# Patient Record
Sex: Female | Born: 1935 | Race: Black or African American | Hispanic: No | Smoking: Former smoker
Health system: Southern US, Community
[De-identification: ages and names within clinical notes are randomized; demographics above are authoritative.]

## PROBLEM LIST (undated history)

## (undated) DIAGNOSIS — E785 Hyperlipidemia, unspecified: Secondary | ICD-10-CM

## (undated) DIAGNOSIS — I499 Cardiac arrhythmia, unspecified: Secondary | ICD-10-CM

## (undated) HISTORY — PX: REPLACEMENT TOTAL KNEE: SUR1224

## (undated) HISTORY — PX: JOINT REPLACEMENT: SHX530

---

## 2018-03-10 ENCOUNTER — Encounter (HOSPITAL_BASED_OUTPATIENT_CLINIC_OR_DEPARTMENT_OTHER): Payer: Self-pay | Admitting: *Deleted

## 2018-03-10 ENCOUNTER — Emergency Department (HOSPITAL_BASED_OUTPATIENT_CLINIC_OR_DEPARTMENT_OTHER): Payer: Medicare Other

## 2018-03-10 ENCOUNTER — Other Ambulatory Visit: Payer: Self-pay

## 2018-03-10 ENCOUNTER — Emergency Department (HOSPITAL_BASED_OUTPATIENT_CLINIC_OR_DEPARTMENT_OTHER)
Admission: EM | Admit: 2018-03-10 | Discharge: 2018-03-10 | Disposition: A | Discharge status: Home / Self Care | Payer: Medicare Other | Attending: Emergency Medicine | Admitting: Emergency Medicine

## 2018-03-10 DIAGNOSIS — Z79899 Other long term (current) drug therapy: Secondary | ICD-10-CM | POA: Diagnosis not present

## 2018-03-10 DIAGNOSIS — S0990XA Unspecified injury of head, initial encounter: Secondary | ICD-10-CM | POA: Diagnosis present

## 2018-03-10 DIAGNOSIS — Z87891 Personal history of nicotine dependence: Secondary | ICD-10-CM | POA: Diagnosis not present

## 2018-03-10 DIAGNOSIS — S0083XA Contusion of other part of head, initial encounter: Secondary | ICD-10-CM | POA: Insufficient documentation

## 2018-03-10 DIAGNOSIS — Y939 Activity, unspecified: Secondary | ICD-10-CM | POA: Insufficient documentation

## 2018-03-10 DIAGNOSIS — Y92512 Supermarket, store or market as the place of occurrence of the external cause: Secondary | ICD-10-CM | POA: Diagnosis not present

## 2018-03-10 DIAGNOSIS — Y999 Unspecified external cause status: Secondary | ICD-10-CM | POA: Insufficient documentation

## 2018-03-10 DIAGNOSIS — W228XXA Striking against or struck by other objects, initial encounter: Secondary | ICD-10-CM | POA: Diagnosis not present

## 2018-03-10 DIAGNOSIS — Z7982 Long term (current) use of aspirin: Secondary | ICD-10-CM | POA: Insufficient documentation

## 2018-03-10 HISTORY — DX: Hyperlipidemia, unspecified: E78.5

## 2018-03-10 HISTORY — DX: Cardiac arrhythmia, unspecified: I49.9

## 2018-03-10 NOTE — ED Triage Notes (Signed)
pt c/o head injury by picture from x 2 hrs ago

## 2018-03-10 NOTE — Discharge Instructions (Signed)
Please read instructions below. You can take tylenol every 4-6 hours as needed for headache. Apply ice to your head for 20 minutes at a time for swelling and pain. Schedule an appointment with your primary care provider to follow up. Return to the ER for severe headache, vision changes, vomiting, or new or concerning symptoms.

## 2018-03-10 NOTE — ED Provider Notes (Signed)
[facility] EMERGENCY DEPARTMENT Provider Note   CSN: [id number] Arrival date & time: 03/10/18  1431     History   Chief Complaint Chief Complaint  Patient presents with  . Head Injury    HPI Raven Baker is a 84 y.o. female presenting to the ED with head injury that occurred prior to arrival.  Patient states she was at a store and a shelf with canvas pictures/artwork the right forehead.  She states she did not have a syncopal episode, nor any other injuries.  She denies headache other than pain associated with contusion.  Denies vision changes, neck pain, nausea or vomiting, or any other complaints.  Not on anticoagulation.  The history is provided by the patient.    Past Medical History:  Diagnosis Date  . Arrhythmia   . Hyperlipidemia     There are no active problems to display for this patient.   Past Surgical History:  Procedure Laterality Date  . JOINT REPLACEMENT       OB History   None      Home Medications    Prior to Admission medications   Medication Sig Start Date End Date Taking? Authorizing Provider  aspirin 81 MG chewable tablet Chew 81 mg by mouth daily.   Yes [provider]  NIFEdipine (PROCARDIA XL/ADALAT-CC) 60 MG 24 hr tablet Take 60 mg by mouth daily.   Yes [provider]  rosuvastatin (CRESTOR) 5 MG tablet Take 5 mg by mouth daily.   Yes [provider]    Family History History reviewed. No pertinent family history.  Social History Social History   Tobacco Use  . Smoking status: Former Games developer  . Smokeless tobacco: Never Used  Substance Use Topics  . Alcohol use: Never    Frequency: Never  . Drug use: Never     Allergies   Plavix [clopidogrel]   Review of Systems Review of Systems  Eyes: Negative for photophobia and visual disturbance.  Gastrointestinal: Negative for nausea and vomiting.  Musculoskeletal: Negative for neck pain.  Neurological: Negative for syncope and headaches.    Hematological: Does not bruise/bleed easily.  All other systems reviewed and are negative.    Physical Exam Updated Vital Signs BP 131/89   Pulse 86   Resp 16   Ht 5\' 2"  (1.575 m)   Wt 76.2 kg (168 lb)   SpO2 97%   BMI 30.73 kg/m   Physical Exam  Constitutional: She is oriented to person, place, and time. She appears well-developed and well-nourished. No distress.  HENT:  Head: Normocephalic.  Hematoma to right forehead, no crepitus or deformity to facial bones or orbits. No wounds.  Eyes: Pupils are equal, round, and reactive to light. Conjunctivae and EOM are normal.  Neck: Normal range of motion. Neck supple.  No c-spine or paraspinal tenderness  Cardiovascular: Normal rate.  Pulmonary/Chest: Effort normal.  Abdominal: Soft.  Neurological: She is alert and oriented to person, place, and time.  Mental Status:  Alert, oriented, thought content appropriate, able to give a coherent history. Speech fluent without evidence of aphasia. Able to follow 2 step commands without difficulty.  Cranial Nerves:  II:  Peripheral visual fields grossly normal, pupils equal, round, reactive to light III,IV, VI: ptosis not present, extra-ocular motions intact bilaterally  V,VII: smile symmetric, facial light touch sensation equal VIII: hearing grossly normal to voice  X: uvula elevates symmetrically  XI: bilateral shoulder shrug symmetric and strong XII: midline tongue extension without fassiculations Motor:  Normal tone. 5/5 in upper and lower extremities bilaterally including strong and equal grip strength and dorsiflexion/plantar flexion Sensory: Pinprick and light touch normal in all extremities.  Deep Tendon Reflexes: 2+ and symmetric in the biceps and patella Cerebellar: normal finger-to-nose with bilateral upper extremities Gait: normal gait and balance CV: distal pulses palpable throughout     Skin: Skin is warm.  Psychiatric: She has a normal mood and affect. Her behavior is  normal.  Nursing note and vitals reviewed.    ED Treatments / Results  Labs (all labs ordered are listed, but only abnormal results are displayed) Labs Reviewed - No data to display  EKG None  Radiology Ct Head Wo Contrast  Result Date: 03/10/2018 CLINICAL DATA:  The patient suffered a blow to the head today when a shelf in a store fell on the patient's head. Initial encounter. EXAM: CT HEAD WITHOUT CONTRAST TECHNIQUE: Contiguous axial images were obtained from the base of the skull through the vertex without intravenous contrast. COMPARISON:  None. FINDINGS: Brain: No evidence of acute infarction, hemorrhage, hydrocephalus, extra-axial collection or mass lesion/mass effect. Atrophy and chronic microvascular ischemic change are noted. Vascular: No hyperdense vessel or unexpected calcification. Skull: Intact. Sinuses/Orbits: Negative. Other: None. IMPRESSION: No acute abnormality. Atrophy and chronic microvascular ischemic change. Electronically Signed   By: Drusilla Kannerhomas  Dalessio M.D.   On: 03/10/2018 16:01    Procedures Procedures (including critical care time)  Medications Ordered in ED Medications - No data to display   Initial Impression / Assessment and Plan / ED Course  I have reviewed the triage vital signs and the nursing notes.  Pertinent labs & imaging results that were available during my care of the patient were reviewed by me and considered in my medical decision making (see chart for details).     Pt with forehead contusion after an object fell onto her head at a store. No LOC, HA, vision changes, N/V. Normal neurologic exam. Not on anticoagulation. CT head neg for acute pathology. Pt declined tylenol in ED, stating pain was only present with palpation of hematoma. Ice pack given. Pt well-appearing, safe for discharge.  Patient discussed with Dr. Dalene SeltzerSchlossman, who agrees with care plan.  Discussed results, findings, treatment and follow up. Patient advised of return  precautions. Patient verbalized understanding and agreed with plan.  Final Clinical Impressions(s) / ED Diagnoses   Final diagnoses:  Contusion of face, initial encounter    ED Discharge Orders    None       Robinson, [place] N, PA-C 03/10/18 1622    Alvira MondaySchlossman, Erin, MD 03/12/18 2243

## 2018-03-17 ENCOUNTER — Ambulatory Visit (INDEPENDENT_AMBULATORY_CARE_PROVIDER_SITE_OTHER): Payer: Medicare Other | Admitting: Allergy and Immunology

## 2018-03-17 ENCOUNTER — Encounter: Payer: Self-pay | Admitting: Allergy and Immunology

## 2018-03-17 VITALS — BP 150/80 | HR 67 | Temp 98.1°F | Resp 16 | Ht 62.0 in | Wt 79.5 lb

## 2018-03-17 DIAGNOSIS — IMO0001 Reserved for inherently not codable concepts without codable children: Secondary | ICD-10-CM

## 2018-03-17 DIAGNOSIS — L500 Allergic urticaria: Secondary | ICD-10-CM | POA: Diagnosis not present

## 2018-03-17 DIAGNOSIS — H6982 Other specified disorders of Eustachian tube, left ear: Secondary | ICD-10-CM | POA: Diagnosis not present

## 2018-03-17 DIAGNOSIS — H6980 Other specified disorders of Eustachian tube, unspecified ear: Secondary | ICD-10-CM | POA: Insufficient documentation

## 2018-03-17 DIAGNOSIS — T7840XD Allergy, unspecified, subsequent encounter: Secondary | ICD-10-CM | POA: Diagnosis not present

## 2018-03-17 DIAGNOSIS — Z91018 Allergy to other foods: Secondary | ICD-10-CM | POA: Insufficient documentation

## 2018-03-17 DIAGNOSIS — J3089 Other allergic rhinitis: Secondary | ICD-10-CM | POA: Diagnosis not present

## 2018-03-17 DIAGNOSIS — T7840XA Allergy, unspecified, initial encounter: Secondary | ICD-10-CM | POA: Insufficient documentation

## 2018-03-17 MED ORDER — FLUTICASONE PROPIONATE 50 MCG/ACT NA SUSP: 1.0000 | Freq: Two times a day (BID) | NASAL | 5 refills | Status: DC | PRN

## 2018-03-17 MED ORDER — FEXOFENADINE HCL 60 MG PO TABS: 60.0000 mg | ORAL_TABLET | Freq: Two times a day (BID) | ORAL | 2 refills | Status: AC | PRN

## 2018-03-17 MED ORDER — FEXOFENADINE HCL 180 MG PO TABS: 180.0000 mg | ORAL_TABLET | Freq: Every day | ORAL | 5 refills | Status: DC

## 2018-03-17 NOTE — Patient Instructions (Addendum)
History of food allergy/allergic reaction Allergic reaction with unclear etiology.  The patients history suggests peanut allergy, though todays skin tests were negative despite a positive histamine control.  Food allergen skin testing has excellent negative predictive value however there is still a 5% chance that the allergy exists.  Therefore, we will investigate further with serum specific IgE levels and, if negative, open graded oral challenge.  A laboratory order form has been provided for serum tryptase level as well as specific IgE against peanut, peanut components, tree nut panel, and alpha gal panel.  Until the food allergy has been definitively ruled out, the patient is to continue meticulous avoidance of peanuts and tree nuts.  Should symptoms recur, a journal is to be kept recording any foods eaten, beverages consumed, and medications taken within a 6 hour time period prior to the onset of symptoms, as well as record activities being performed, and environmental conditions. For any symptoms concerning for anaphylaxis, 911 is to be called immediately.  Seasonal and perennial allergic rhinitis  Aeroallergen avoidance measures have been discussed and provided in written form.  Prescription has been provided for fexofenadine 60 mg twice daily if needed.  A prescription has been provided for fluticasone nasal spray, one spray per nostril 1-2 times daily as needed. Proper nasal spray technique has been discussed and demonstrated.  Nasal saline spray (i.e., Simply Saline) or nasal saline lavage (i.e., NeilMed) is recommended as needed and prior to medicated nasal sprays.  Eustachian tube dysfunction  Allergen avoidance measures, fluticasone nasal spray as needed, and nasal saline irrigation as needed.   When lab results have returned the patient will be called with further recommendations and follow up instructions.  Control of House Dust Mite Allergen  House dust mites play a major  role in allergic asthma and rhinitis.  They occur in environments with high humidity wherever human skin, the food for dust mites is found. High levels have been detected in dust obtained from mattresses, pillows, carpets, upholstered furniture, bed covers, clothes and soft toys.  The principal allergen of the house dust mite is found in its feces.  A gram of dust may contain 1,000 mites and 250,000 fecal particles.  Mite antigen is easily measured in the air during house cleaning activities.    1. Encase mattresses, including the box spring, and pillow, in an air tight cover.  Seal the zipper end of the encased mattresses with wide adhesive tape. 2. Wash the bedding in water of 130 degrees Farenheit weekly.  Avoid cotton comforters/quilts and flannel bedding: the most ideal bed covering is the dacron comforter. 3. Remove all upholstered furniture from the bedroom. 4. Remove carpets, carpet padding, rugs, and non-washable window drapes from the bedroom.  Wash drapes weekly or use plastic window coverings. 5. Remove all non-washable stuffed toys from the bedroom.  Wash stuffed toys weekly. 6. Have the room cleaned frequently with a vacuum cleaner and a damp dust-mop.  The patient should not be in a room which is being cleaned and should wait 1 hour after cleaning before going into the room. 7. Close and seal all heating outlets in the bedroom.  Otherwise, the room will become filled with dust-laden air.  An electric heater can be used to heat the room. 8. Reduce indoor humidity to less than 50%.  Do not use a humidifier.  Reducing Pollen Exposure  The American Academy of Allergy, Asthma and Immunology suggests the following steps to reduce your exposure to pollen during allergy seasons.  1. Do not hang sheets or clothing out to dry; pollen may collect on these items. 2. Do not mow lawns or spend time around freshly cut grass; mowing stirs up pollen. 3. Keep windows closed at night.  Keep car  windows closed while driving. 4. Minimize morning activities outdoors, a time when pollen counts are usually at their highest. 5. Stay indoors as much as possible when pollen counts or humidity is high and on windy days when pollen tends to remain in the air longer. 6. Use air conditioning when possible.  Many air conditioners have filters that trap the pollen spores. 7. Use a HEPA room air filter to remove pollen form the indoor air you breathe.

## 2018-03-17 NOTE — Assessment & Plan Note (Addendum)
Allergic reaction with unclear etiology.  The patients history suggests peanut allergy, though todays skin tests were negative despite a positive histamine control.  Food allergen skin testing has excellent negative predictive value however there is still a 5% chance that the allergy exists.  Therefore, we will investigate further with serum specific IgE levels and, if negative, open graded oral challenge.  A laboratory order form has been provided for serum tryptase level as well as specific IgE against peanut, peanut components, tree nut panel, and alpha gal panel.  Until the food allergy has been definitively ruled out, the patient is to continue meticulous avoidance of peanuts and tree nuts.  Should symptoms recur, a journal is to be kept recording any foods eaten, beverages consumed, and medications taken within a 6 hour time period prior to the onset of symptoms, as well as record activities being performed, and environmental conditions. For any symptoms concerning for anaphylaxis, 911 is to be called immediately.

## 2018-03-17 NOTE — Progress Notes (Signed)
New Patient Note  RE: Raven Baker MRN: [id number] DOB: 06-Aug-1934 Date of Office Visit: 03/17/2018  Referring provider: Vernona Rieger, MD Primary care provider: Vernona Rieger, MD  Chief Complaint: Allergic Reaction and Pruritus   History of present illness: [name] is a 84 y.o. female seen today in consultation requested by Vernona Rieger, MD.  She typically consumes a handful of peanuts almost every evening before going to bed.  Approximately 3 weeks ago, she consumed peanuts and shortly thereafter experienced generalized pruritus.  Few days later, she had a handful of peanuts and within 15 minutes developed severe pruritus on her upper body and noted that her chest was "all red."  She denies concomitant angioedema, cardiopulmonary symptoms, or GI symptoms.  She has carefully avoided peanuts and tree nuts since that time.  She does not recall what she had consumed for dinner on those evenings. Raven Baker experiences nasal congestion, rhinorrhea, sneezing, postnasal drainage, and occasional left ear pressure/pain.  These symptoms occur year-round but are most frequent and severe during the springtime.  She believes that her allergies are related to tree pollen exposure.  Assessment and plan: History of food allergy/allergic reaction Allergic reaction with unclear etiology.  The patients history suggests peanut allergy, though todays skin tests were negative despite a positive histamine control.  Food allergen skin testing has excellent negative predictive value however there is still a 5% chance that the allergy exists.  Therefore, we will investigate further with serum specific IgE levels and, if negative, open graded oral challenge.  A laboratory order form has been provided for serum tryptase level as well as specific IgE against peanut, peanut components, tree nut panel, and alpha gal panel.  Until the food allergy has been definitively ruled out, the patient is to continue  meticulous avoidance of peanuts and tree nuts.  Should symptoms recur, a journal is to be kept recording any foods eaten, beverages consumed, and medications taken within a 6 hour time period prior to the onset of symptoms, as well as record activities being performed, and environmental conditions. For any symptoms concerning for anaphylaxis, 911 is to be called immediately.  Seasonal and perennial allergic rhinitis  Aeroallergen avoidance measures have been discussed and provided in written form.  Prescription has been provided for fexofenadine 60 mg twice daily if needed.  A prescription has been provided for fluticasone nasal spray, one spray per nostril 1-2 times daily as needed. Proper nasal spray technique has been discussed and demonstrated.  Nasal saline spray (i.e., Simply Saline) or nasal saline lavage (i.e., NeilMed) is recommended as needed and prior to medicated nasal sprays.  Eustachian tube dysfunction  Allergen avoidance measures, fluticasone nasal spray as needed, and nasal saline irrigation as needed.   Meds ordered this encounter  Medications  . fluticasone (FLONASE) 50 MCG/ACT nasal spray    Sig: Place 1 spray into both nostrils 2 (two) times daily as needed for allergies or rhinitis.    Dispense:  18.2 g    Refill:  5  . DISCONTD: fexofenadine (ALLEGRA) 180 MG tablet    Sig: Take 1 tablet (180 mg total) by mouth daily.    Dispense:  30 tablet    Refill:  5  . fexofenadine (ALLEGRA) 60 MG tablet    Sig: Take 1 tablet (60 mg total) by mouth 2 (two) times daily as needed for allergies or rhinitis.    Dispense:  60 tablet    Refill:  2    Diagnostics: Environmental skin testing:  Positive to grass pollen and dust mite antigen. Food allergen skin testing: Negative despite a positive histamine control.    Physical examination: Blood pressure (!) 150/80, pulse 67, temperature 98.1 F (36.7 C), temperature source Oral, resp. rate 16, height  (1.575 m),  weight 79 lb 8 oz (36.1 kg), SpO2 96 %.  General: Alert, interactive, in no acute distress. HEENT: TMs pearly gray, turbinates moderately edematous without discharge, post-pharynx mildly erythematous. Neck: Supple without lymphadenopathy. Lungs: Clear to auscultation without wheezing, rhonchi or rales. CV: Normal S1, S2 without murmurs. Abdomen: Nondistended, nontender. Skin: Warm and dry, without lesions or rashes. Extremities:  No clubbing, cyanosis or edema. Neuro:   Grossly intact.  Review of systems:  Review of systems negative except as noted in HPI / PMHx or noted below: Review of Systems  Constitutional: Negative.   HENT: Negative.   Eyes: Negative.   Respiratory: Negative.   Cardiovascular: Negative.   Gastrointestinal: Negative.   Genitourinary: Negative.   Musculoskeletal: Negative.   Skin: Negative.   Neurological: Negative.   Endo/Heme/Allergies: Negative.   Psychiatric/Behavioral: Negative.     Past medical history:  Past Medical History:  Diagnosis Date  . Arrhythmia   . Hyperlipidemia     Past surgical history:  Past Surgical History:  Procedure Laterality Date  . JOINT REPLACEMENT    . REPLACEMENT TOTAL KNEE      Family history: Family History  Problem Relation Age of Onset  . Allergic rhinitis Neg Hx   . Angioedema Neg Hx   . Asthma Neg Hx   . Eczema Neg Hx   . Immunodeficiency Neg Hx   . Urticaria Neg Hx     Social history: Social History   Socioeconomic History  . Marital status: Widowed    Spouse name: Not on file  . Number of children: Not on file  . Years of education: Not on file  . Highest education level: Not on file  Occupational History  . Not on file  Social Needs  . Financial resource strain: Not on file  . Food insecurity:    Worry: Not on file    Inability: Not on file  . Transportation needs:    Medical: Not on file    Non-medical: Not on file  Tobacco Use  . Smoking status: Former Games developer  . Smokeless tobacco:  Never Used  Substance and Sexual Activity  . Alcohol use: Never    Frequency: Never  . Drug use: Never  . Sexual activity: Not on file  Lifestyle  . Physical activity:    Days per week: Not on file    Minutes per session: Not on file  . Stress: Not on file  Relationships  . Social connections:    Talks on phone: Not on file    Gets together: Not on file    Attends religious service: Not on file    Active member of club or organization: Not on file    Attends meetings of clubs or organizations: Not on file    Relationship status: Not on file  . Intimate partner violence:    Fear of current or ex partner: Not on file    Emotionally abused: Not on file    Physically abused: Not on file    Forced sexual activity: Not on file  Other Topics Concern  . Not on file  Social History Narrative  . Not on file   Environmental History: The patient lives in a 82 year old house with hardwood floors  throughout and central air/heat.  There is no known mold/water damage in the home.  She is a non-smoker without pets.  Allergies as of 03/17/2018      Reactions   Streptomycin Other (See Comments), Rash   Unknown    Plavix [clopidogrel]       Medication List        Accurate as of 03/17/18 10:54 AM. Always use your most recent med list.          alendronate 70 MG tablet Commonly known as:  FOSAMAX Take 70 mg by mouth daily.   aspirin 81 MG chewable tablet Chew 81 mg by mouth daily.   calcium carbonate 600 MG Tabs tablet Commonly known as:  OS-CAL Take 600 mg by mouth daily.   fexofenadine 60 MG tablet Commonly known as:  ALLEGRA Take 1 tablet (60 mg total) by mouth 2 (two) times daily as needed for allergies or rhinitis.   fluticasone 50 MCG/ACT nasal spray Commonly known as:  FLONASE Place 1 spray into both nostrils 2 (two) times daily as needed for allergies or rhinitis.   NIFEdipine 60 MG 24 hr tablet Commonly known as:  PROCARDIA XL/ADALAT-CC Take 60 mg by mouth daily.     rosuvastatin 5 MG tablet Commonly known as:  CRESTOR Take 5 mg by mouth daily.   THERA Tabs Take 1 tablet by mouth daily.   Vitamin D-3 1000 units Caps Take 1,000 capsules by mouth daily.       Known medication allergies: Allergies  Allergen Reactions  . Streptomycin Other (See Comments) and Rash    Unknown    . Plavix [Clopidogrel]     I appreciate the opportunity to take part in [facility] care. Please do not hesitate to contact me with questions.  Sincerely,   R. Jorene Guest, MD

## 2018-03-17 NOTE — Assessment & Plan Note (Signed)
   Allergen avoidance measures, fluticasone nasal spray as needed, and nasal saline irrigation as needed.

## 2018-03-17 NOTE — Assessment & Plan Note (Signed)
   Aeroallergen avoidance measures have been discussed and provided in written form.  Prescription has been provided for fexofenadine 60 mg twice daily if needed.  A prescription has been provided for fluticasone nasal spray, one spray per nostril 1-2 times daily as needed. Proper nasal spray technique has been discussed and demonstrated.  Nasal saline spray (i.e., Simply Saline) or nasal saline lavage (i.e., NeilMed) is recommended as needed and prior to medicated nasal sprays.

## 2018-03-28 LAB — ALPHA-GAL PANEL
Alpha Gal IgE*: <0.1 kU/L (ref <0.10)
Beef (Bos spp) IgE: <0.1 kU/L (ref <0.35)
Class Interpretation: 0
Class Interpretation: 0
Class Interpretation: 0
Lamb/Mutton (Ovis spp) IgE: <0.1 kU/L (ref <0.35)
Pork (Sus spp) IgE: <0.1 kU/L (ref <0.35)

## 2018-03-28 LAB — TRYPTASE: Tryptase: 6.2 ug/L (ref 2.2–13.2)

## 2018-03-28 LAB — ALLERGENS(7)
Brazil Nut IgE: <0.1 kU/L
F020-IgE Almond: <0.1 kU/L
F202-IgE Cashew Nut: <0.1 kU/L
Hazelnut (Filbert) IgE: <0.1 kU/L
Peanut IgE: <0.1 kU/L
Pecan Nut IgE: <0.1 kU/L
Walnut IgE: <0.1 kU/L

## 2018-03-28 LAB — ALLERGEN PISTACHIO F203: F203-IgE Pistachio Nut: <0.1 kU/L

## 2018-05-05 ENCOUNTER — Encounter: Payer: Self-pay | Admitting: Allergy and Immunology

## 2018-05-05 ENCOUNTER — Ambulatory Visit (INDEPENDENT_AMBULATORY_CARE_PROVIDER_SITE_OTHER): Payer: Medicare Other | Admitting: Allergy and Immunology

## 2018-05-05 VITALS — BP 120/68 | HR 88 | Temp 98.1°F | Resp 16

## 2018-05-05 DIAGNOSIS — Z91018 Allergy to other foods: Secondary | ICD-10-CM

## 2018-05-05 DIAGNOSIS — T7840XD Allergy, unspecified, subsequent encounter: Secondary | ICD-10-CM

## 2018-05-05 MED ORDER — EPINEPHRINE 0.3 MG/0.3ML IJ SOAJ: INTRAMUSCULAR | 1 refills | Status: DC

## 2018-05-05 NOTE — Assessment & Plan Note (Signed)
The patient had negative in vivo and in vitro tests to peanut and was able to tolerate the open graded oral challenge today without adverse signs or symptoms. Therefore, she has the same risk of systemic reaction associated with the consumption of peanut as the general population.  Foods containing peanut may be re-introduced into the diet, gradually at first with access to diphenhydramine and epinephrine auto-injectors.  Should symptoms recur, a journal is to be kept recording any foods eaten, beverages consumed, and medications taken within a 6 hour time period prior to the onset of symptoms, as well as record activities being performed, and environmental conditions. For any symptoms concerning for anaphylaxis, epinephrine is to be administered and 911 is to be called immediately.

## 2018-05-05 NOTE — Progress Notes (Signed)
Follow-up Note  RE: Raven ColumbiaMary Baker MRN: [id number] DOB: August 27, 1934 Date of Office Visit: 05/05/2018  Primary care provider: Vernona Riegerlark, Katherine, MD Referring provider: Vernona Riegerlark, Katherine, MD  History of present illness: Raven ColumbiaMary Baker is a 84 y.o. female with a history of allergic reactions presenting today for open graded oral challenge to peanut butter.  She was previously seen in this clinic on Mar 17, 2018 for her initial evaluation.  Her history suggests peanut allergy, however she had negative food allergen skin testing and negative blood work.   Assessment and plan: History of food allergy/allergic reaction The patient had negative in vivo and in vitro tests to peanut and was able to tolerate the open graded oral challenge today without adverse signs or symptoms. Therefore, she has the same risk of systemic reaction associated with the consumption of peanut as the general population.  Foods containing peanut may be re-introduced into the diet, gradually at first with access to diphenhydramine and epinephrine auto-injectors.  Should symptoms recur, a journal is to be kept recording any foods eaten, beverages consumed, and medications taken within a 6 hour time period prior to the onset of symptoms, as well as record activities being performed, and environmental conditions. For any symptoms concerning for anaphylaxis, epinephrine is to be administered and 911 is to be called immediately.   Meds ordered this encounter  Medications  . EPINEPHrine 0.3 mg/0.3 mL IJ SOAJ injection    Sig: Use as directed for severe allergic reactions    Dispense:  2 Device    Refill:  1    Dispense mylan generic brand only.    Diagnostics: Open graded peanut butter oral challenge: The patient was able to tolerate the challenge today without adverse signs or symptoms. Vital signs were stable throughout the challenge and observation period.     Physical examination: Blood pressure 120/68, pulse 88, temperature  98.1 F (36.7 C), temperature source Oral, resp. rate 16, SpO2 95 %.  General: Alert, interactive, in no acute distress. Neck: Supple without lymphadenopathy. Lungs: Clear to auscultation without wheezing, rhonchi or rales. CV: Normal S1, S2 without murmurs. Skin: Warm and dry, without lesions or rashes.  The following portions of the patient's history were reviewed and updated as appropriate: allergies, current medications, past family history, past medical history, past social history, past surgical history and problem list.  Allergies as of 05/05/2018      Reactions   Streptomycin Other (See Comments), Rash   Unknown    Plavix [clopidogrel]       Medication List        Accurate as of 05/05/18  1:17 PM. Always use your most recent med list.          alendronate 70 MG tablet Commonly known as:  FOSAMAX Take 70 mg by mouth daily.   aspirin 81 MG chewable tablet Chew 81 mg by mouth daily.   calcium carbonate 600 MG Tabs tablet Commonly known as:  OS-CAL Take 600 mg by mouth daily.   EPINEPHrine 0.3 mg/0.3 mL Soaj injection Commonly known as:  EPI-PEN Use as directed for severe allergic reactions   fexofenadine 60 MG tablet Commonly known as:  ALLEGRA Take 1 tablet (60 mg total) by mouth 2 (two) times daily as needed for allergies or rhinitis.   fluticasone 50 MCG/ACT nasal spray Commonly known as:  FLONASE Place 1 spray into both nostrils 2 (two) times daily as needed for allergies or rhinitis.   NIFEdipine 60 MG 24 hr tablet Commonly  known as:  PROCARDIA XL/ADALAT-CC Take 60 mg by mouth daily.   rosuvastatin 5 MG tablet Commonly known as:  CRESTOR Take 5 mg by mouth daily.   THERA Tabs Take 1 tablet by mouth daily.   Vitamin D-3 1000 units Caps Take 1,000 capsules by mouth daily.       Allergies  Allergen Reactions  . Streptomycin Other (See Comments) and Rash    Unknown    . Plavix [Clopidogrel]     I appreciate the opportunity to take part in  [facility] care. Please do not hesitate to contact me with questions.  Sincerely,   R. Jorene Guest, MD

## 2018-05-05 NOTE — Patient Instructions (Addendum)
History of food allergy/allergic reaction The patient had negative in vivo and in vitro tests to peanut and was able to tolerate the open graded oral challenge today without adverse signs or symptoms. Therefore, she has the same risk of systemic reaction associated with the consumption of peanut as the general population.  Foods containing peanut may be re-introduced into the diet, gradually at first with access to diphenhydramine and epinephrine auto-injectors.  Should symptoms recur, a journal is to be kept recording any foods eaten, beverages consumed, and medications taken within a 6 hour time period prior to the onset of symptoms, as well as record activities being performed, and environmental conditions. For any symptoms concerning for anaphylaxis, epinephrine is to be administered and 911 is to be called immediately.   Return if symptoms worsen or fail to improve.

## 2018-07-15 ENCOUNTER — Ambulatory Visit (INDEPENDENT_AMBULATORY_CARE_PROVIDER_SITE_OTHER): Payer: Medicare Other | Admitting: Family Medicine

## 2018-07-15 ENCOUNTER — Encounter: Payer: Self-pay | Admitting: Family Medicine

## 2018-07-15 VITALS — BP 102/74 | HR 76 | Temp 98.4°F | Resp 20

## 2018-07-15 DIAGNOSIS — J3089 Other allergic rhinitis: Secondary | ICD-10-CM | POA: Diagnosis not present

## 2018-07-15 DIAGNOSIS — L500 Allergic urticaria: Secondary | ICD-10-CM

## 2018-07-15 DIAGNOSIS — Z91018 Allergy to other foods: Secondary | ICD-10-CM | POA: Diagnosis not present

## 2018-07-15 DIAGNOSIS — IMO0001 Reserved for inherently not codable concepts without codable children: Secondary | ICD-10-CM

## 2018-07-15 MED ORDER — RANITIDINE HCL 150 MG PO TABS: ORAL_TABLET | ORAL | 5 refills | Status: DC

## 2018-07-15 MED ORDER — RANITIDINE HCL 150 MG PO TABS: 150.0000 mg | ORAL_TABLET | Freq: Every day | ORAL | 5 refills | Status: DC

## 2018-07-15 NOTE — Progress Notes (Addendum)
[address] [place] [id number] Dept: [phone]  FOLLOW UP NOTE  Patient ID: Raven Baker, female    DOB: 01/15/1934  Age: 84 y.o. MRN: [id number] Date of Office Visit: 07/15/2018  Assessment  Chief Complaint: Allergic Reaction  HPI Raven Baker is an 84 year old female who presents to the clinic today for an acute visit.  She originally presented to this office on 03/17/2018 for evaluation of pruritus and skin color change to red.  At that time, her allergy skin test to the adult food panel was negative in its entirety.  In addition, her environmental skin allergy testing was positive to grass and dust mite. Labs indicate that alpha gal was negative, peanut and tree nut was negative, tryptase was normal. She was last seen in this clinic on 05/05/2018 by Dr. Nunzio CobbsBobbitt for evaluation of peanut allergy with an office food challenge.  At that time, she was able to tolerate the peanut challenge and peanuts have been integrated back into her diet.  At today's visit, Raven DandyMary reports that on Monday, 2 days ago, about 10 minutes after breakfast consisting of spinach, egg, cheese, onion, wheat flatbed, and coffee she began experiencing intense itching on her back and thighs which resolved 15 minutes after taking Benadryl and applying anti-itch cream. She reports that she has been keeping a meticulous food journal since the incident and she has eaten these foods since the incident with no itching noted, with the exception of spinach. She denies associated cardiopulmonary symptoms, gastrointestinal symptoms, and angioedema.  She reports that she has been experiencing itching and red skin since the beginning of March.  She currently takes Allegra 60 mg once a day as needed which averages 1-2 times a week.  She reports the itching is intermittent and can occur 1 to 2 days in a row or she can go several days with no symptoms as well.  She denies new medications, foods, soaps, or perfumes.  She denies increased  stress, night sweats, fever, and new joint or muscle pain. She is currently in stage III kidney failure.   Allergic rhinitis is reported as well controlled with Allegra as needed. She reports that she does not have dust mite free covers on her mattress or pillows, at this time.   Her current medications are listed in the chart.   Drug Allergies:  Allergies  Allergen Reactions  . Streptomycin Other (See Comments) and Rash    Unknown    . Plavix [Clopidogrel]     Physical Exam: BP 102/74 (BP Location: Right Arm, Patient Position: Sitting, Cuff Size: Normal)   Pulse 76   Temp 98.4 F (36.9 C) (Oral)   Resp 20    Physical Exam  Constitutional: She is oriented to person, place, and time. She appears well-developed and well-nourished.  HENT:  Head: Normocephalic.  Right Ear: External ear normal.  Left Ear: External ear normal.  Mouth/Throat: Oropharynx is clear and moist.  Bilateral nares slightly erythematous with clear nasal drainage noted.  Pharynx normal.  Ears normal.  Eyes normal.  Eyes: Conjunctivae are normal.  Neck: Normal range of motion. Neck supple.  Cardiovascular: Normal rate, regular rhythm and normal heart sounds.  No murmur or irregular rhythm noted today in the office  Pulmonary/Chest: Effort normal and breath sounds normal.  Lungs clear to auscultation  Musculoskeletal: Normal range of motion.  Neurological: She is alert and oriented to person, place, and time.  Skin: Skin is warm and dry.  No rash or redness noted  today in the office  Psychiatric: She has a normal mood and affect. Her behavior is normal. Judgment and thought content normal.      Assessment and Plan: 1. Allergy, urticaria   2. Seasonal and perennial allergic rhinitis   3. History of food allergy/allergic reaction     Meds ordered this encounter  Medications  . ranitidine (ZANTAC) 150 MG tablet    Sig: Take 1 tablet (150 mg total) by mouth at bedtime.    Dispense:  30 tablet     Refill:  5  . ranitidine (ZANTAC) 150 MG tablet    Sig: Take one tablet once a day to help reduce itch.    Dispense:  34 tablet    Refill:  5    Patient Instructions  Allergy, urticaria Continue to keep a journal detailing any foods, medications and beverages consumed within a 6 hour time period before the hives occurred.  Avoid spinach for now as well as any food(s) causing symptoms Begin Allegra 60 mg twice a day to help reduce itch Begin ranitidine 150 mg once a day to help reduce itch In case of an allergic reaction, take Benadryl 50 mg every 4 hours, and if life-threatening symptoms occur, inject with EpiPen 0.3 mg and call 911 immediatley Consider further blood work if antihistamines are not effective in reducing her itch  Seasonal and perennial allergic rhinitis (positive to dust mite and grass pollens) Allegra as above Flonase nasal spray 1-2 sprays in each nostril once a day as needed for a stuffy nose Continue allergy avoidance measures. Continue to use dust mite free covers  History of food allergy/allergic reaction Skin allergy testing was negative to all foods tested on 03/17/2018 Peanut challenge was passed and peanuts have been reintegrated into the diet.   Call me if this treatment plan is not working well for you  Follow up in 3 months or sooner if needed  Return in about 3 months (around 10/15/2018), or if symptoms worsen or fail to improve.    Thank you for the opportunity to care for this patient.  Please do not hesitate to contact me with questions.  Thermon Leyland, FNP [facility]  _________________________________________________  I have provided oversight concerning [facility]'s evaluation and treatment of this patient's health issues addressed during today's encounter.  I agree with the assessment and therapeutic plan as outlined in the note.   Signed,   R Jorene Guest, MD

## 2018-07-15 NOTE — Patient Instructions (Addendum)
Allergy, urticaria Continue to keep a journal detailing any foods, medications and beverages consumed within a 6 hour time period before the hives occurred.  Avoid spinach for now Begin Allegra 60 mg twice a day to help reduce itch Begin ranitidine 150 mg once a day to help reduce itch In case of an allergic reaction, take Benadryl 50 mg every 4 hours, and if life-threatening symptoms occur, inject with EpiPen 0.3 mg.   Seasonal and perennial allergic rhinitis (positive to dust mite and grass pollens) Allegra as above Flonase nasal spray 1-2 sprays in each nostril once a day as needed for a stuffy nose Continue allergy avoidance measures. Continue to use dust mite free covers  History of food allergy/allergic reaction Skin allergy testing was negative to all foods tested on 03/17/2018 Peanut challenge was completed and peanuts have been reintegrated into the diet.   Call me if this treatment plan is not working well for you  Follow up in 3 months or sooner if needed

## 2019-05-25 ENCOUNTER — Other Ambulatory Visit: Payer: Self-pay | Admitting: Family Medicine

## 2019-05-25 MED ORDER — FAMOTIDINE 40 MG PO TABS: 40.0000 mg | ORAL_TABLET | Freq: Every day | ORAL | 0 refills | Status: DC

## 2019-05-25 NOTE — Telephone Encounter (Signed)
PT called because she went to pharmacy to get refill and was told that med was taken off the shelf. Does not know what med name is. Only recall med in chart is zantac. She says this med that she needs is for itch. Please confirm and send alternative to [facility] [place] [phone]

## 2019-05-25 NOTE — Telephone Encounter (Signed)
Sent Pepcid. Pt informed. Pt was due for ov- scheduled ov for tomorrow.

## 2019-05-26 ENCOUNTER — Other Ambulatory Visit: Payer: Self-pay | Admitting: *Deleted

## 2019-05-26 ENCOUNTER — Encounter: Payer: Self-pay | Admitting: Family Medicine

## 2019-05-26 ENCOUNTER — Ambulatory Visit (INDEPENDENT_AMBULATORY_CARE_PROVIDER_SITE_OTHER): Payer: Medicare Other | Admitting: Family Medicine

## 2019-05-26 ENCOUNTER — Other Ambulatory Visit: Payer: Self-pay

## 2019-05-26 VITALS — BP 124/76 | HR 82 | Temp 97.0°F | Resp 16 | Ht 62.0 in | Wt 175.2 lb

## 2019-05-26 DIAGNOSIS — Z91018 Allergy to other foods: Secondary | ICD-10-CM

## 2019-05-26 DIAGNOSIS — L299 Pruritus, unspecified: Secondary | ICD-10-CM | POA: Diagnosis not present

## 2019-05-26 DIAGNOSIS — J3089 Other allergic rhinitis: Secondary | ICD-10-CM

## 2019-05-26 MED ORDER — FAMOTIDINE 20 MG PO TABS: ORAL_TABLET | ORAL | 5 refills | Status: AC

## 2019-05-26 NOTE — Progress Notes (Addendum)
[address] [place] [id number] Dept: [phone]  FOLLOW UP NOTE  Patient ID: Raven Baker, female    DOB: 06/26/1934  Age: 85 y.o. MRN: [id number] Date of Office Visit: 05/26/2019  Assessment  Chief Complaint: Allergic Reaction  HPI Raven Baker is an 85 year old female who presents to the clinic for a follow up visit. She was last seen in the clinic on y Thermon Leylandnne Shondra Capps, FNP for evaluation of allergic rhinitis, urticaria, and food allergy. She reports allergic urticaria has been moderately well controlled with breakthrough itching that is occurring a few days a week around breakfast time and lasting for 10 minutes. She denies redness of hives. She is not currently avoiding any foods. She does not experience concomitant angioedema, cardiopulmonary, or gastrointestinal symptoms with the itch. She reports she is not currently using Allegra. Allergic rhinitis is reported as well controlled with no medical intervention at this time. Her current medications are listed in the chart.   Drug Allergies:  Allergies  Allergen Reactions  . Streptomycin Other (See Comments) and Rash    Unknown    . Plavix [Clopidogrel]     Physical Exam: BP 124/76   Pulse 82   Temp (!) 97 F (36.1 C) (Temporal)   Resp 16   Ht 5\' 2"  (1.575 m)   Wt 175 lb 3.2 oz (79.5 kg)   SpO2 99%   BMI 32.04 kg/m    Physical Exam Vitals signs reviewed.  Constitutional:      Appearance: Normal appearance.  HENT:     Head: Normocephalic and atraumatic.     Right Ear: Tympanic membrane normal.     Left Ear: Tympanic membrane normal.     Nose:     Comments: Bilateral nares slightly erythematous with clear nasal drainage noted. Pharynx normal. Ears normal. Eyes normal.    Mouth/Throat:     Pharynx: Oropharynx is clear.  Eyes:     Conjunctiva/sclera: Conjunctivae normal.  Neck:     Musculoskeletal: Normal range of motion and neck supple.  Cardiovascular:     Rate and Rhythm: Normal rate and regular rhythm.    Heart sounds: Normal heart sounds. No murmur.  Pulmonary:     Effort: Pulmonary effort is normal.     Breath sounds: Normal breath sounds.     Comments: Lungs clear to auscultation Musculoskeletal: Normal range of motion.  Skin:    General: Skin is warm and dry.     Comments: No rash noted  Neurological:     Mental Status: She is alert and oriented to person, place, and time.  Psychiatric:        Mood and Affect: Mood normal.        Behavior: Behavior normal.        Thought Content: Thought content normal.        Judgment: Judgment normal.    Assessment and Plan: 1. Seasonal and perennial allergic rhinitis   2. History of food allergy/allergic reaction   3. Pruritus     Patient Instructions  Pruritus Continue to keep a journal detailing any foods, medications and beverages consumed within a 6 hour time period before the hives occurred.  Continue Allegra 60 mg once a day as needed. Increase to 60 mg twice a day as needed for breakthrough itching Continue famotidine 20 mg once or twice a day as needed to reduce itch Continue a daily moisturizing lotion In case of an allergic reaction, take Benadryl 50 mg every 4 hours, and  if life-threatening symptoms occur, inject with EpiPen 0.3 mg.   Seasonal and perennial allergic rhinitis (positive to dust mite and grass pollens) Allegra as needed as above Flonase nasal spray 1-2 sprays in each nostril once a day as needed for a stuffy nose Continue allergy avoidance measures. Continue to use dust mite free covers  History of food allergy/allergic reaction Skin allergy testing was negative to all foods tested on 03/17/2018 Peanut challenge was completed and peanuts have been reintegrated into the diet.   Call me if this treatment plan is not working well for you  Follow up in 6 months or sooner if needed   Return in about 6 months (around 11/26/2019), or if symptoms worsen or fail to improve.    Thank you for the opportunity to care  for this patient.  Please do not hesitate to contact me with questions.  Gareth Morgan, FNP [facility]  _________________________________________________  I have provided oversight concerning [name]'s evaluation and treatment of this patient's health issues addressed during today's encounter.  I agree with the assessment and therapeutic plan as outlined in the note.   Signed,   R Edgar Frisk, MD

## 2019-05-26 NOTE — Patient Instructions (Addendum)
Pruritus Continue to keep a journal detailing any foods, medications and beverages consumed within a 6 hour time period before the hives occurred.  Continue Allegra 60 mg once a day as needed. Increase to 60 mg twice a day as needed for breakthrough itching Continue famotidine 20 mg once or twice a day as needed to reduce itch Continue a daily moisturizing lotion In case of an allergic reaction, take Benadryl 50 mg every 4 hours, and if life-threatening symptoms occur, inject with EpiPen 0.3 mg.   Seasonal and perennial allergic rhinitis (positive to dust mite and grass pollens) Allegra as needed as above Flonase nasal spray 1-2 sprays in each nostril once a day as needed for a stuffy nose Continue allergy avoidance measures. Continue to use dust mite free covers  History of food allergy/allergic reaction Skin allergy testing was negative to all foods tested on 03/17/2018 Peanut challenge was completed and peanuts have been reintegrated into the diet.   Call me if this treatment plan is not working well for you  Follow up in 6 months or sooner if needed

## 2019-06-02 ENCOUNTER — Telehealth: Payer: Self-pay | Admitting: *Deleted

## 2019-06-02 MED ORDER — TRIAMCINOLONE ACETONIDE 0.1 % EX CREA: TOPICAL_CREAM | CUTANEOUS | 3 refills | Status: AC

## 2019-06-02 NOTE — Telephone Encounter (Signed)
Can you please send in triamcinolone 0.1% cream for red itchy areas under her face and neck twice a day as needed. Please have her call id this is not effective. Thank you

## 2019-06-02 NOTE — Telephone Encounter (Signed)
Pt called complaining of rash that has gotten worse since her apt last week. She is taking famotidine and allegra as prescribed and that helps with the itch but not helping with the rash. She is using otc benadryl cream for the rash. Please advise.

## 2019-06-02 NOTE — Telephone Encounter (Signed)
Pt informed and she will call back if rash worsens.

## 2019-06-17 ENCOUNTER — Other Ambulatory Visit: Payer: Self-pay | Admitting: Family Medicine

## 2019-10-24 ENCOUNTER — Telehealth: Payer: Self-pay

## 2019-10-24 NOTE — Telephone Encounter (Signed)
I haven't seen the patient in a year and a half. Please send this to [facility] as she has seen her the last 2 times.  Thanks.

## 2019-10-24 NOTE — Telephone Encounter (Signed)
ptt had a allergic reaction on back last night starting at the bottom and going up her back. She had only had a multi grain bread and a salad. Pt took benadryl liquid and applied anti itch cream. After an hour it had subsided. Pt is fine today but is scared of eating and having another reaction. She wants to know if theres anything else she might be allergic too as everything was negative?

## 2019-10-25 NOTE — Telephone Encounter (Signed)
Please advise 

## 2019-10-25 NOTE — Telephone Encounter (Signed)
Scheduled pt to see dr bobbitt in person as she would like for jan 7th

## 2019-10-26 ENCOUNTER — Other Ambulatory Visit: Payer: Self-pay

## 2019-10-26 MED ORDER — EPINEPHRINE 0.3 MG/0.3ML IJ SOAJ: 0.3000 mg | Freq: Once | INTRAMUSCULAR | 1 refills | Status: AC

## 2019-10-26 NOTE — Telephone Encounter (Signed)
She is doing a journal, itch and some raising on the rash. No sob no pain epipen is current

## 2019-10-26 NOTE — Telephone Encounter (Signed)
Can you please have her begin a journal of events occurring 4-6 hours before she gets the rash including foods, beverages, medications, and personal care products. Thank you. Did she have a raised rash or itch alone? Any sob or abdominal pain? Please make sure she has an up to date epipen. Thank you

## 2019-10-26 NOTE — Telephone Encounter (Signed)
YES MAM EPI ORDERED

## 2019-10-26 NOTE — Telephone Encounter (Signed)
Can you please order an epipen for her? Thank you

## 2019-10-26 NOTE — Telephone Encounter (Signed)
Thank you :)

## 2019-11-04 ENCOUNTER — Other Ambulatory Visit: Payer: Self-pay

## 2019-11-04 ENCOUNTER — Telehealth: Payer: Self-pay | Admitting: Allergy and Immunology

## 2019-11-04 MED ORDER — EPINEPHRINE 0.3 MG/0.3ML IJ SOAJ: INTRAMUSCULAR | 1 refills | Status: AC

## 2019-11-04 NOTE — Telephone Encounter (Signed)
PT's epi pens have expired and need [facility] [address], [place].

## 2019-11-04 NOTE — Telephone Encounter (Signed)
Refilled epi pens 0.3 mg sent into the  pt.'s pharmacy. [facility] [address] [place]

## 2019-11-24 ENCOUNTER — Ambulatory Visit: Payer: Medicare Other | Admitting: Allergy and Immunology

## 2019-11-28 ENCOUNTER — Ambulatory Visit: Payer: Medicare Other | Admitting: Family Medicine

## 2020-03-09 IMAGING — CT CT HEAD W/O CM
3 series · 16 of 47 positions shown, 19 images · non-contrast
Comparison: None.

CLINICAL DATA: The patient suffered a blow to the head today when a
shelf in a store fell on the patient's head. Initial encounter.

EXAM:
CT HEAD WITHOUT CONTRAST
TECHNIQUE: Contiguous axial images were obtained from the base of the skull
through the vertex without intravenous contrast.

[Series 2: head wo · axial · 0.42mm/px · z∈[-159,-34]mm · 10 of 31 slices shown, 13 images]
[im 3/31  brain]
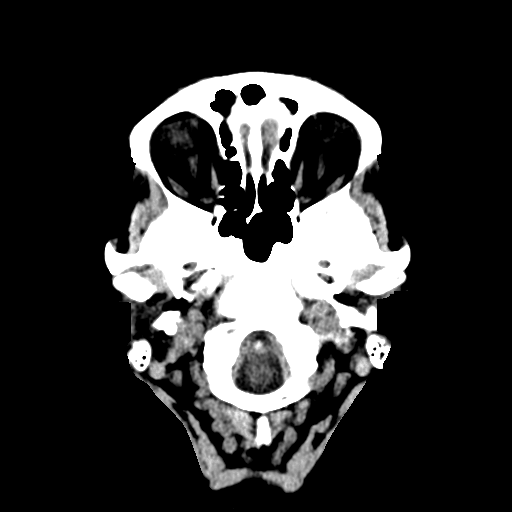
[im 3/31  bone]
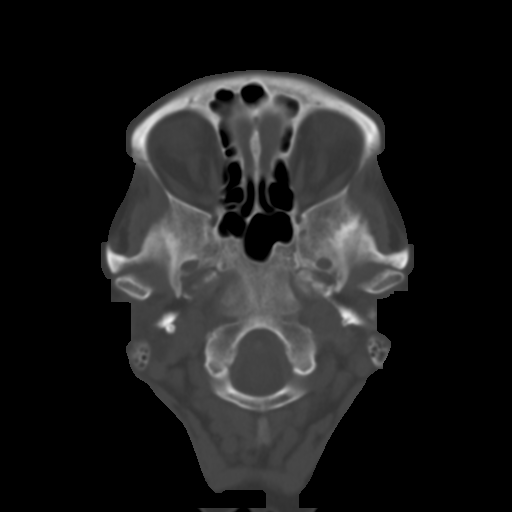
[im 6/31  brain]
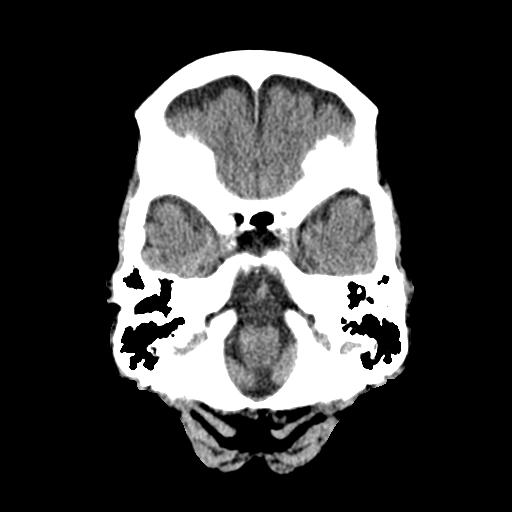
[im 9/31  brain]
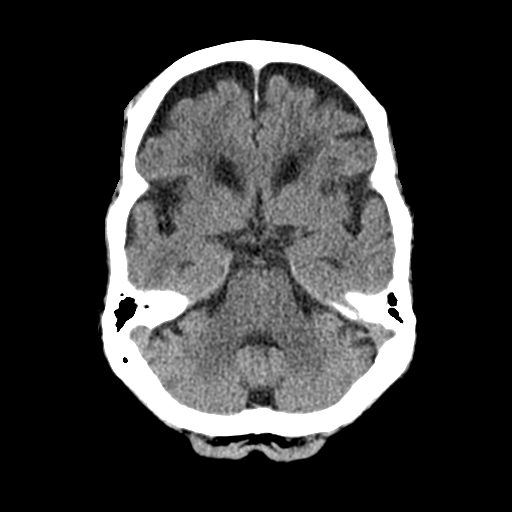
[im 11/31  brain]
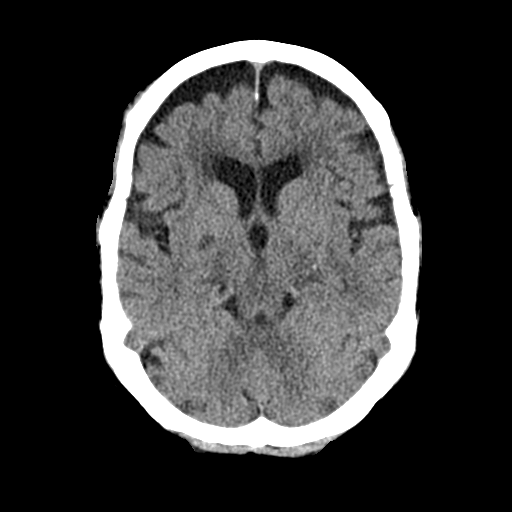
[im 14/31  brain]
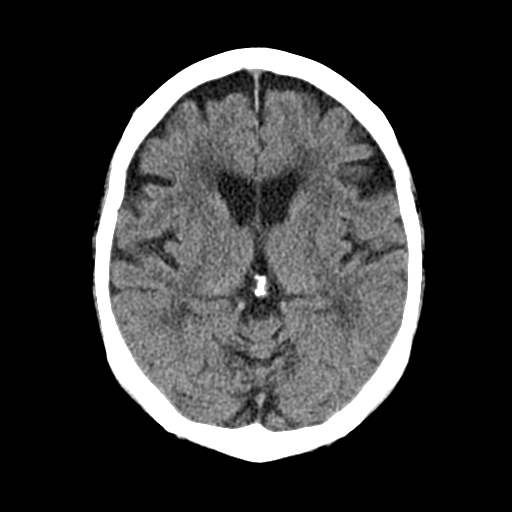
[im 14/31  bone]
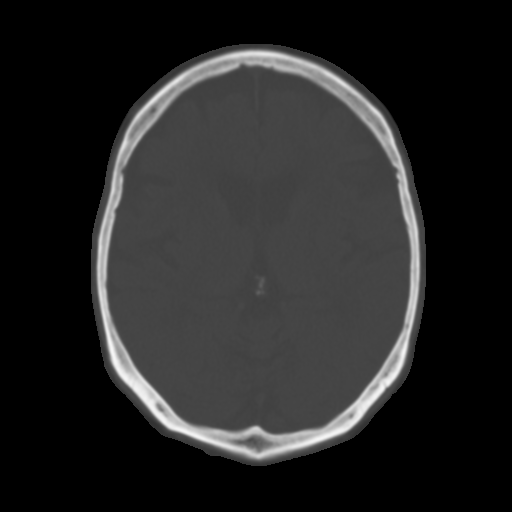
[im 17/31  brain]
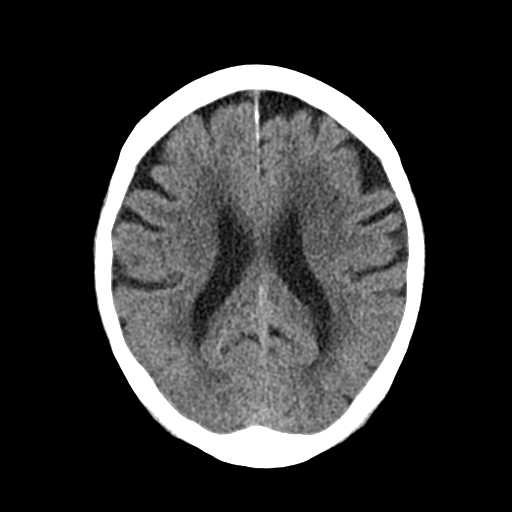
[im 20/31  brain]
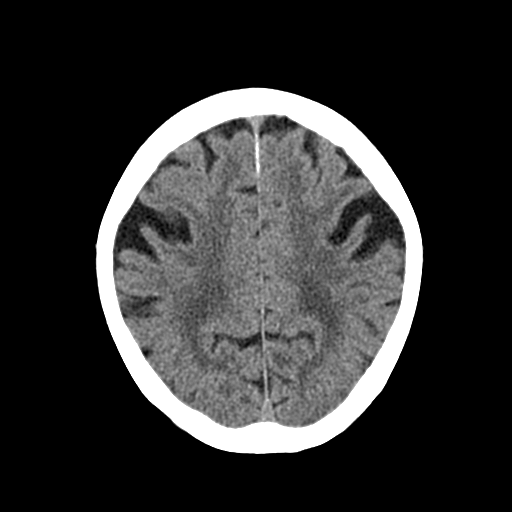
[im 23/31  brain]
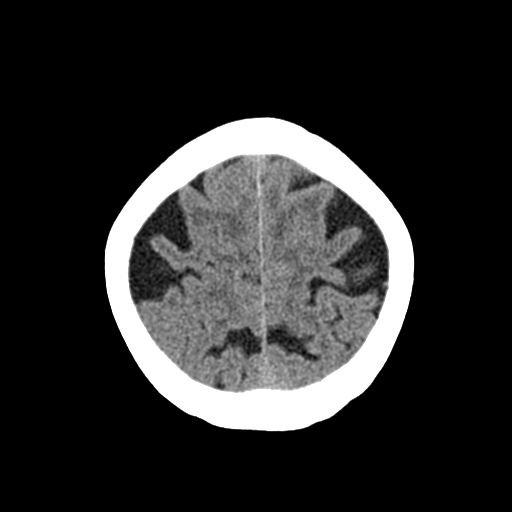
[im 25/31  brain]
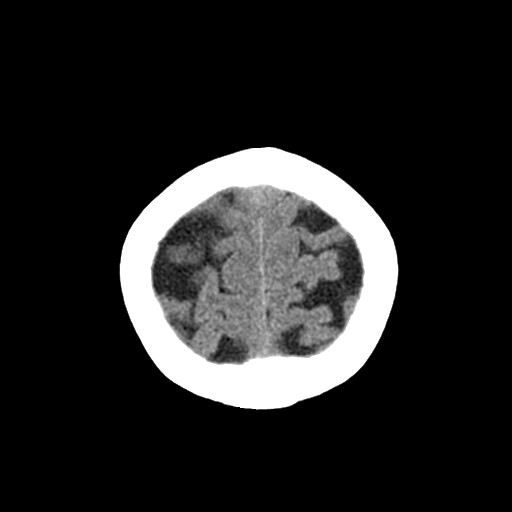
[im 25/31  bone]
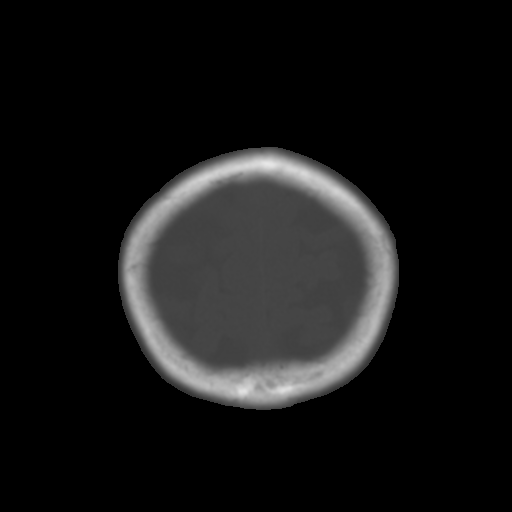
[im 28/31  brain]
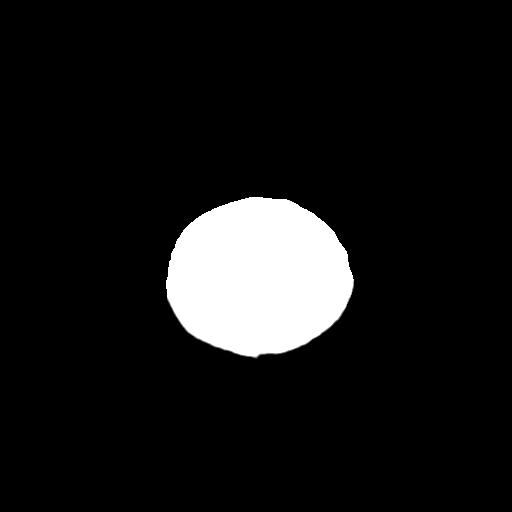

[Series 4: cor soft · coronal · 0.30mm/px · 3 of 64 slices shown]
[im 22/64  brain]
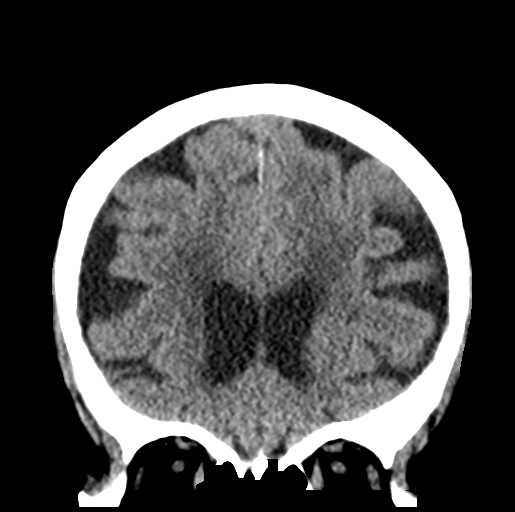
[im 29/64  brain]
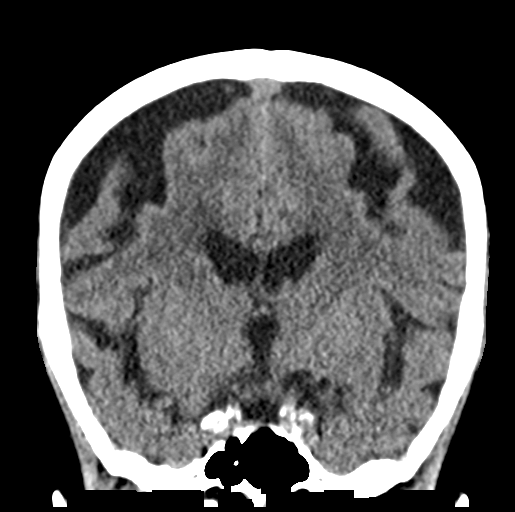
[im 36/64  brain]
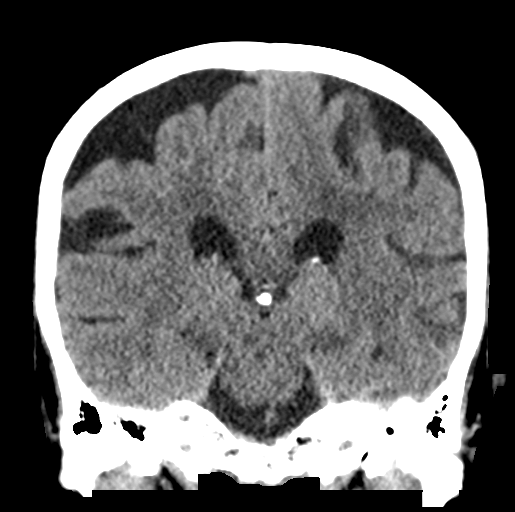

[Series 5: sag soft · sagittal · 0.31mm/px · 3 of 49 slices shown]
[im 17/49  brain]
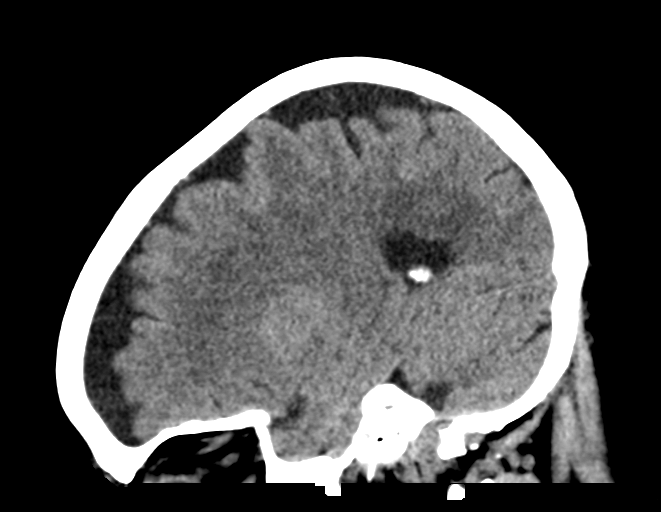
[im 25/49  brain]
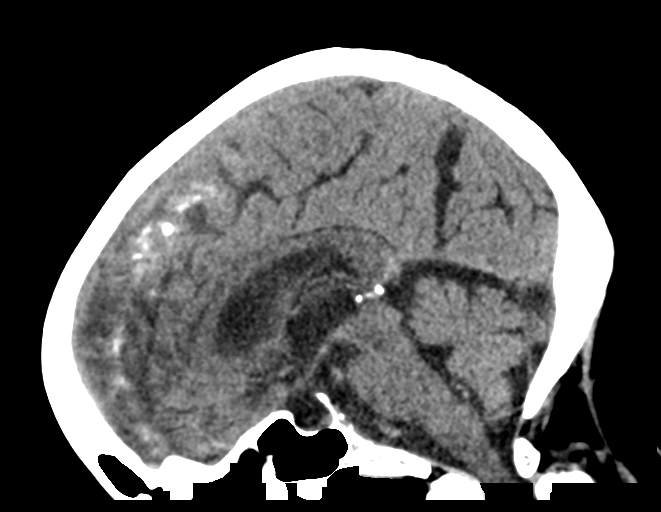
[im 33/49  brain]
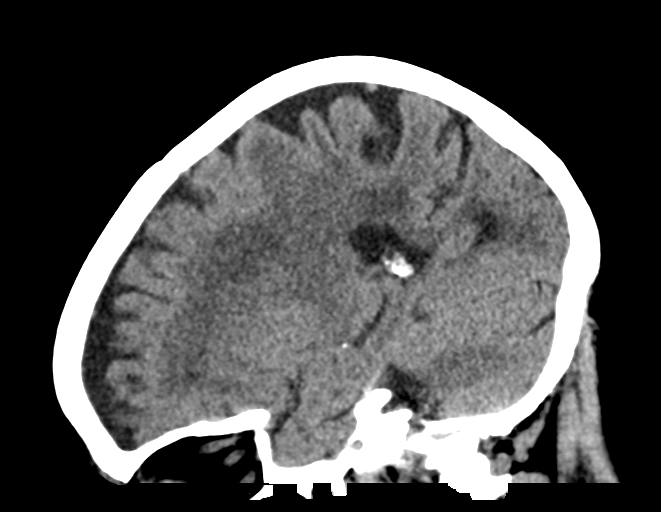

[16 of 47 positions shown; findings below may reference images not displayed]

FINDINGS: Brain: No evidence of acute infarction, hemorrhage, hydrocephalus,
extra-axial collection or mass lesion/mass effect. Atrophy and
chronic microvascular ischemic change are noted.

Vascular: No hyperdense vessel or unexpected calcification.

Skull: Intact.

Sinuses/Orbits: Negative.

Other: None.
IMPRESSION: No acute abnormality.

Atrophy and chronic microvascular ischemic change.
